# Patient Record
Sex: Female | Born: 1960 | Race: White | Hispanic: No | Marital: Married | Smoking: Never smoker
Health system: Southern US, Community
[De-identification: ages and names within clinical notes are randomized; demographics above are authoritative.]

## PROBLEM LIST (undated history)

## (undated) DIAGNOSIS — K219 Gastro-esophageal reflux disease without esophagitis: Secondary | ICD-10-CM

## (undated) DIAGNOSIS — R42 Dizziness and giddiness: Secondary | ICD-10-CM

## (undated) DIAGNOSIS — F329 Major depressive disorder, single episode, unspecified: Secondary | ICD-10-CM

## (undated) DIAGNOSIS — F419 Anxiety disorder, unspecified: Secondary | ICD-10-CM

## (undated) DIAGNOSIS — J3489 Other specified disorders of nose and nasal sinuses: Secondary | ICD-10-CM

## (undated) DIAGNOSIS — J309 Allergic rhinitis, unspecified: Secondary | ICD-10-CM

## (undated) DIAGNOSIS — I10 Essential (primary) hypertension: Secondary | ICD-10-CM

## (undated) DIAGNOSIS — F32A Depression, unspecified: Secondary | ICD-10-CM

## (undated) DIAGNOSIS — E785 Hyperlipidemia, unspecified: Secondary | ICD-10-CM

## (undated) HISTORY — DX: Major depressive disorder, single episode, unspecified: F32.9

## (undated) HISTORY — PX: TONSILLECTOMY AND ADENOIDECTOMY: SUR1326

## (undated) HISTORY — DX: Dizziness and giddiness: R42

## (undated) HISTORY — DX: Essential (primary) hypertension: I10

## (undated) HISTORY — DX: Hyperlipidemia, unspecified: E78.5

## (undated) HISTORY — DX: Gastro-esophageal reflux disease without esophagitis: K21.9

## (undated) HISTORY — DX: Depression, unspecified: F32.A

## (undated) HISTORY — DX: Anxiety disorder, unspecified: F41.9

## (undated) HISTORY — DX: Allergic rhinitis, unspecified: J30.9

---

## 1985-12-29 HISTORY — PX: LIPOMA EXCISION: SHX5283

## 2005-08-11 ENCOUNTER — Ambulatory Visit: Payer: Self-pay | Admitting: Family Medicine

## 2006-11-24 ENCOUNTER — Ambulatory Visit: Payer: Self-pay | Admitting: Family Medicine

## 2007-04-03 ENCOUNTER — Emergency Department: Payer: Self-pay | Admitting: Emergency Medicine

## 2008-01-05 ENCOUNTER — Ambulatory Visit: Payer: Self-pay | Admitting: Family Medicine

## 2009-01-18 DIAGNOSIS — F341 Dysthymic disorder: Secondary | ICD-10-CM | POA: Insufficient documentation

## 2009-02-09 DIAGNOSIS — H8110 Benign paroxysmal vertigo, unspecified ear: Secondary | ICD-10-CM | POA: Insufficient documentation

## 2009-02-21 ENCOUNTER — Ambulatory Visit: Payer: Self-pay | Admitting: Family Medicine

## 2009-03-06 ENCOUNTER — Ambulatory Visit: Payer: Self-pay | Admitting: Family Medicine

## 2009-09-27 ENCOUNTER — Ambulatory Visit: Payer: Self-pay | Admitting: Family Medicine

## 2010-08-05 LAB — HM PAP SMEAR

## 2010-11-05 ENCOUNTER — Ambulatory Visit: Payer: Self-pay | Admitting: Family Medicine

## 2012-01-19 ENCOUNTER — Ambulatory Visit: Payer: Self-pay | Admitting: Family Medicine

## 2012-02-17 ENCOUNTER — Ambulatory Visit: Payer: Self-pay | Admitting: Urology

## 2013-05-10 ENCOUNTER — Ambulatory Visit: Payer: Self-pay | Admitting: Family Medicine

## 2013-05-10 LAB — CBC WITH DIFFERENTIAL/PLATELET
Basophil #: 0.2 10*3/uL — ABNORMAL HIGH (ref 0.0–0.1)
Basophil %: 3 %
Eosinophil #: 0.1 10*3/uL (ref 0.0–0.7)
Eosinophil %: 2.1 %
HGB: 13.2 g/dL (ref 12.0–16.0)
Lymphocyte #: 1.6 10*3/uL (ref 1.0–3.6)
Lymphocyte %: 24.2 %
MCH: 29.6 pg (ref 26.0–34.0)
MCHC: 33.9 g/dL (ref 32.0–36.0)
MCV: 87 fL (ref 80–100)
Monocyte #: 0.5 x10 3/mm (ref 0.2–0.9)
Monocyte %: 7.4 %
Neutrophil %: 63.3 %
Platelet: 254 10*3/uL (ref 150–440)

## 2013-05-10 LAB — LIPID PANEL
Cholesterol: 277 mg/dL — ABNORMAL HIGH (ref 0–200)
Ldl Cholesterol, Calc: 197 mg/dL — ABNORMAL HIGH (ref 0–100)
Triglycerides: 233 mg/dL — ABNORMAL HIGH (ref 0–200)

## 2013-05-10 LAB — COMPREHENSIVE METABOLIC PANEL
Albumin: 3.9 g/dL (ref 3.4–5.0)
Alkaline Phosphatase: 88 U/L (ref 50–136)
Anion Gap: 8 (ref 7–16)
BUN: 13 mg/dL (ref 7–18)
Bilirubin,Total: 0.6 mg/dL (ref 0.2–1.0)
Calcium, Total: 9 mg/dL (ref 8.5–10.1)
Chloride: 100 mmol/L (ref 98–107)
Co2: 30 mmol/L (ref 21–32)
Creatinine: 0.99 mg/dL (ref 0.60–1.30)
EGFR (African American): >60
Glucose: 104 mg/dL — ABNORMAL HIGH (ref 65–99)
Osmolality: 276 (ref 275–301)
Potassium: 3.8 mmol/L (ref 3.5–5.1)
SGOT(AST): 43 U/L — ABNORMAL HIGH (ref 15–37)
Total Protein: 7.5 g/dL (ref 6.4–8.2)

## 2013-05-10 LAB — TSH: Thyroid Stimulating Horm: 1.99 u[IU]/mL

## 2014-09-20 ENCOUNTER — Ambulatory Visit: Payer: Self-pay | Admitting: Family Medicine

## 2014-09-20 LAB — CBC AND DIFFERENTIAL
HCT: 42 % (ref 36–46)
Hemoglobin: 14.2 g/dL (ref 12.0–16.0)
Platelets: 254 10*3/uL (ref 150–399)
WBC: 6.3 10*3/mL

## 2014-09-20 LAB — LIPID PANEL
Cholesterol: 261 mg/dL — AB (ref 0–200)
HDL: 38 mg/dL (ref 35–70)
LDL Cholesterol: 189 mg/dL
Triglycerides: 169 mg/dL — AB (ref 40–160)

## 2014-09-20 LAB — TSH: TSH: 1.65 u[IU]/mL (ref 0.41–5.90)

## 2014-09-20 LAB — HEPATIC FUNCTION PANEL
ALT: 24 U/L (ref 7–35)
AST: 24 U/L (ref 13–35)

## 2014-09-20 LAB — BASIC METABOLIC PANEL
BUN: 16 mg/dL (ref 4–21)
Creatinine: 1.1 mg/dL (ref 0.5–1.1)
GLUCOSE: 101 mg/dL
Potassium: 4.1 mmol/L (ref 3.4–5.3)
Sodium: 141 mmol/L (ref 137–147)

## 2015-07-03 ENCOUNTER — Encounter: Payer: Self-pay | Admitting: Family Medicine

## 2015-07-03 ENCOUNTER — Encounter: Payer: Self-pay | Admitting: *Deleted

## 2015-07-03 ENCOUNTER — Ambulatory Visit (INDEPENDENT_AMBULATORY_CARE_PROVIDER_SITE_OTHER): Payer: 59 | Admitting: Family Medicine

## 2015-07-03 VITALS — BP 118/84 | HR 69 | Temp 98.4°F | Resp 16 | Wt 193.0 lb

## 2015-07-03 DIAGNOSIS — N3090 Cystitis, unspecified without hematuria: Secondary | ICD-10-CM | POA: Diagnosis not present

## 2015-07-03 DIAGNOSIS — I10 Essential (primary) hypertension: Secondary | ICD-10-CM | POA: Insufficient documentation

## 2015-07-03 DIAGNOSIS — K219 Gastro-esophageal reflux disease without esophagitis: Secondary | ICD-10-CM | POA: Insufficient documentation

## 2015-07-03 DIAGNOSIS — F419 Anxiety disorder, unspecified: Secondary | ICD-10-CM | POA: Insufficient documentation

## 2015-07-03 DIAGNOSIS — F329 Major depressive disorder, single episode, unspecified: Secondary | ICD-10-CM | POA: Insufficient documentation

## 2015-07-03 DIAGNOSIS — F32A Depression, unspecified: Secondary | ICD-10-CM | POA: Insufficient documentation

## 2015-07-03 DIAGNOSIS — J309 Allergic rhinitis, unspecified: Secondary | ICD-10-CM | POA: Insufficient documentation

## 2015-07-03 DIAGNOSIS — R319 Hematuria, unspecified: Secondary | ICD-10-CM | POA: Insufficient documentation

## 2015-07-03 DIAGNOSIS — E7800 Pure hypercholesterolemia, unspecified: Secondary | ICD-10-CM | POA: Insufficient documentation

## 2015-07-03 LAB — POCT URINALYSIS DIPSTICK
BILIRUBIN UA: NEGATIVE
Glucose, UA: NEGATIVE
LEUKOCYTES UA: NEGATIVE
Nitrite, UA: NEGATIVE
PH UA: 6
Protein, UA: NEGATIVE
Urobilinogen, UA: 0.2

## 2015-07-03 MED ORDER — ALPRAZOLAM 0.5 MG PO TABS: ORAL_TABLET | ORAL | Status: AC

## 2015-07-03 MED ORDER — NITROFURANTOIN MONOHYD MACRO 100 MG PO CAPS: 100.0000 mg | ORAL_CAPSULE | Freq: Two times a day (BID) | ORAL | Status: DC

## 2015-07-03 NOTE — Patient Instructions (Signed)
Continue increased fluid intake and AZO or Uristat as needed.

## 2015-07-03 NOTE — Progress Notes (Signed)
Subjective:     Patient ID: Lorraine Robinson, female   DOB: 08-Apr-1961, 54 y.o.   MRN: [id number]  HPI  Chief Complaint  Patient presents with  . Urinary Frequency  . Urinary Urgency  . Back Pain  States onset 7/1. No recent infection noted on chart review.    Review of Systems  Constitutional: Negative for fever and chills.  Genitourinary: Negative for vaginal discharge.       Reports prior negative GU workup for hematuria  Psychiatric/Behavioral:       Wishes to refill Xanax prior to a vacation. States prior rx expired as she did not use it.       Objective:   Physical Exam  Constitutional: She appears well-developed and well-nourished. No distress.  Genitourinary:  No cva tenderness       Assessment:    1. Cystitis-suggested treating symptoms until culture available and holding antibiotic - Urine culture - POCT urinalysis dipstick - nitrofurantoin, macrocrystal-monohydrate, (MACROBID) 100 MG capsule; Take 1 capsule (100 mg total) by mouth 2 (two) times daily.  Dispense: 14 capsule; Refill: 0  2. Acute anxiety - ALPRAZolam (XANAX) 0.5 MG tablet; 1/2 to one as needed for anxiety  Dispense: 15 tablet; Refill: 0    Plan:    Pending culture results.

## 2015-07-05 ENCOUNTER — Telehealth: Payer: Self-pay

## 2015-07-05 LAB — PLEASE NOTE

## 2015-07-05 LAB — URINE CULTURE

## 2015-07-05 NOTE — Telephone Encounter (Signed)
Left message to call back  

## 2015-07-05 NOTE — Telephone Encounter (Signed)
-----   Message from Carmon Ginsberg, [place] sent at 07/05/2015  7:46 AM EDT ----- No specific organism detected on culture. May continue antibiotic if helping.

## 2015-07-09 NOTE — Telephone Encounter (Signed)
Pt is return call.  CB#[id number]/MW

## 2015-07-10 NOTE — Telephone Encounter (Signed)
LMTCB

## 2015-07-16 NOTE — Telephone Encounter (Signed)
Patient was advised. KW 

## 2015-09-26 ENCOUNTER — Encounter: Payer: Self-pay | Admitting: Family Medicine

## 2015-09-26 ENCOUNTER — Other Ambulatory Visit: Payer: Self-pay | Admitting: Family Medicine

## 2015-09-26 MED ORDER — METOPROLOL SUCCINATE ER 25 MG PO TB24: 25.0000 mg | ORAL_TABLET | Freq: Every day | ORAL | Status: DC

## 2015-09-28 ENCOUNTER — Ambulatory Visit (INDEPENDENT_AMBULATORY_CARE_PROVIDER_SITE_OTHER): Payer: 59 | Admitting: Family Medicine

## 2015-09-28 ENCOUNTER — Encounter: Payer: Self-pay | Admitting: Family Medicine

## 2015-09-28 VITALS — BP 128/82 | HR 68 | Temp 98.6°F | Resp 16 | Wt 190.4 lb

## 2015-09-28 DIAGNOSIS — I10 Essential (primary) hypertension: Secondary | ICD-10-CM

## 2015-09-28 DIAGNOSIS — F329 Major depressive disorder, single episode, unspecified: Secondary | ICD-10-CM

## 2015-09-28 DIAGNOSIS — F419 Anxiety disorder, unspecified: Secondary | ICD-10-CM

## 2015-09-28 DIAGNOSIS — F32A Depression, unspecified: Secondary | ICD-10-CM

## 2015-09-28 NOTE — Progress Notes (Signed)
Patient ID: Lorraine Robinson, female   DOB: 01-Jun-1961, 54 y.o.   MRN: [id number]    Subjective:  HPI Patient reports she is having increased anxiety due to receiving a jury summons for court. Patient states she received the summons 2 weeks ago, and her anxiety is worse the closer she gets to the date of jury duty.  Has been on a jury before and it did not go well.   Had such a bad experience. Caused a panic attacks.  Has trouble sitting in one place without an escape. Stays in back at church.  Always thinks something bad is going to happen.  Consumes all her thoughts.  Has really become a phobia.  Worries about what will happen while she is on a  jury duty. Family illness and deaths always happen when she is not available. Her sister fell down the escalator while she was away.  Freaked out before her last jury summons and had to see her doctor.   Does have physical symptoms with palpitations and panic attacks.  Does not like people staring at her.  Is on medication for her anxiety.   Had to cut it back because she feels lifeless.   Prior to Admission medications   Medication Sig Start Date End Date Taking? Authorizing Provider  ALPRAZolam Duanne Moron) 0.5 MG tablet 1/2 to one as needed for anxiety 07/03/15  Yes Carmon Ginsberg, PA  citalopram (CELEXA) 20 MG tablet Take by mouth. 08/29/13  Yes Historical Provider, MD  fluticasone (FLONASE) 50 MCG/ACT nasal spray Place into the nose. 09/14/14  Yes Historical Provider, MD  hydrochlorothiazide (HYDRODIURIL) 12.5 MG tablet Take by mouth. 01/22/15  Yes Historical Provider, MD  loratadine (CLARITIN) 10 MG tablet Take by mouth. 08/07/10  Yes Historical Provider, MD  metoprolol succinate (TOPROL-XL) 25 MG 24 hr tablet Take 1 tablet (25 mg total) by mouth daily. 09/26/15  Yes Margarita Rana, MD  naproxen sodium (ALEVE) 220 MG tablet Take by mouth.   Yes Historical Provider, MD    Patient Active Problem List   Diagnosis Date Noted  . Allergic rhinitis 07/03/2015  .  Anxiety 07/03/2015  . Depression 07/03/2015  . GERD (gastroesophageal reflux disease) 07/03/2015  . Hematuria 07/03/2015  . Hypercholesteremia 07/03/2015  . Hypertension 07/03/2015  . Disease of nasal cavity and sinuses 08/07/2010  . Vertigo, benign positional 02/09/2009  . Dysthymic disorder 01/18/2009  . Morbid obesity 12/29/1998    Past Medical History  Diagnosis Date  . Hyperlipidemia   . Depression   . Anxiety   . Hypertension   . GERD (gastroesophageal reflux disease)   . Vertigo   . Allergic rhinitis     Social History   Social History  . Marital Status: Married    Spouse Name: N/A  . Number of Children: N/A  . Years of Education: [employer]   Occupational History  . Employed    Social History Main Topics  . Smoking status: Never Smoker   . Smokeless tobacco: Not on file  . Alcohol Use: No  . Drug Use: No  . Sexual Activity: Not on file   Other Topics Concern  . Not on file   Social History Narrative    No Known Allergies  Review of Systems  Constitutional: Negative.   HENT: Negative.   Eyes: Negative.   Respiratory: Negative.   Cardiovascular: Negative.   Gastrointestinal: Negative.   Genitourinary: Negative.   Musculoskeletal: Negative.   Skin: Negative.   Neurological: Negative.   Endo/Heme/Allergies:  Negative.   Psychiatric/Behavioral: The patient is nervous/anxious.     Immunization History  Administered Date(s) Administered  . Td 06/28/2005  . Tdap 08/06/2011   Objective:  BP 128/82 mmHg  Pulse 68  Temp(Src) 98.6 F (37 C) (Oral)  Resp 16  Wt 190 lb 6.4 oz (86.365 kg)  Physical Exam  Constitutional: She is oriented to person, place, and time and well-developed, well-nourished, and in no distress.  Neurological: She is alert and oriented to person, place, and time.  Psychiatric: Mood, memory, affect and judgment normal.    Assessment and Plan :  1. Essential hypertension Condition is stable. Please continue current  medication and  plan of care as noted.    2. Anxiety Worsening since got jury summons. Will write note to excuse patient from jury duty.  Also, continue medication and consider therapy.   3. Depression As above. Follow up for CPE.     [facility] 09/28/2015 10:53 AM

## 2015-10-10 ENCOUNTER — Encounter: Payer: Self-pay | Admitting: Family Medicine

## 2015-10-10 ENCOUNTER — Ambulatory Visit (INDEPENDENT_AMBULATORY_CARE_PROVIDER_SITE_OTHER): Payer: 59 | Admitting: Family Medicine

## 2015-10-10 VITALS — BP 160/90 | HR 67 | Temp 98.0°F | Resp 16 | Ht 64.0 in | Wt 191.0 lb

## 2015-10-10 DIAGNOSIS — R319 Hematuria, unspecified: Secondary | ICD-10-CM | POA: Diagnosis not present

## 2015-10-10 DIAGNOSIS — Z1211 Encounter for screening for malignant neoplasm of colon: Secondary | ICD-10-CM

## 2015-10-10 DIAGNOSIS — Z1239 Encounter for other screening for malignant neoplasm of breast: Secondary | ICD-10-CM | POA: Diagnosis not present

## 2015-10-10 DIAGNOSIS — E7800 Pure hypercholesterolemia, unspecified: Secondary | ICD-10-CM | POA: Diagnosis not present

## 2015-10-10 DIAGNOSIS — Z0000 Encounter for general adult medical examination without abnormal findings: Secondary | ICD-10-CM

## 2015-10-10 DIAGNOSIS — I10 Essential (primary) hypertension: Secondary | ICD-10-CM

## 2015-10-10 LAB — POCT URINALYSIS DIPSTICK
BILIRUBIN UA: NEGATIVE
Glucose, UA: NEGATIVE
Ketones, UA: NEGATIVE
LEUKOCYTES UA: NEGATIVE
NITRITE UA: NEGATIVE
PH UA: 5
PROTEIN UA: NEGATIVE
Spec Grav, UA: 1.015
UROBILINOGEN UA: 0.2

## 2015-10-10 MED ORDER — HYDROCHLOROTHIAZIDE 12.5 MG PO TABS: 12.5000 mg | ORAL_TABLET | Freq: Every day | ORAL | Status: DC

## 2015-10-10 NOTE — Progress Notes (Signed)
Patient ID: Lorraine Robinson, female   DOB: 1961-11-13, 54 y.o.   MRN: [id number]        Patient: Lorraine Robinson, Female    DOB: 04-08-1961, 54 y.o.   MRN: [id number] Visit Date: 10/10/2015  Today's Provider: Margarita Rana, MD   Chief Complaint  Patient presents with  . Annual Exam   Subjective:    Annual physical exam Lorraine Robinson is a 54 y.o. female who presents today for health maintenance and complete physical. She feels well. She reports exercising daily. She reports she is sleeping well.  09/14/14 CPE 09/14/14 Pap-neg; HPV-neg 09/20/14 Mammo-BI-RADS 1 11/24/11 EKG  Results for orders placed or performed in visit on 10/10/15  POCT urinalysis dipstick  Result Value Ref Range   Color, UA straw    Clarity, UA clear    Glucose, UA neg    Bilirubin, UA neg    Ketones, UA neg    Spec Grav, UA 1.015    Blood, UA trace    pH, UA 5.0    Protein, UA neg    Urobilinogen, UA 0.2    Nitrite, UA neg    Leukocytes, UA Negative Negative    Lab Results  Component Value Date   WBC 6.3 09/20/2014   HGB 14.2 09/20/2014   HCT 42 09/20/2014   PLT 254 09/20/2014   GLUCOSE 104* 05/10/2013   CHOL 261* 09/20/2014   TRIG 169* 09/20/2014   HDL 38 09/20/2014   LDLCALC 189 09/20/2014   ALT 24 09/20/2014   AST 24 09/20/2014   NA 141 09/20/2014   K 4.1 09/20/2014   CL 100 05/10/2013   CREATININE 1.1 09/20/2014   BUN 16 09/20/2014   CO2 30 05/10/2013   TSH 1.65 09/20/2014    -----------------------------------------------------------------   Review of Systems  Constitutional: Negative.   HENT: Negative.   Eyes: Negative.   Respiratory: Negative.   Cardiovascular: Negative.   Endocrine: Negative.   Genitourinary: Negative.   Musculoskeletal: Negative.   Skin: Negative.   Allergic/Immunologic: Negative.   Neurological: Negative.   Hematological: Negative.   Psychiatric/Behavioral: Negative.     Social History She  reports that she has  never smoked. She does not have any smokeless tobacco history on file. She reports that she does not drink alcohol or use illicit drugs. Social History   Social History  . Marital Status: Married    Spouse Name: N/A  . Number of Children: N/A  . Years of Education: [employer]   Occupational History  . Employed    Social History Main Topics  . Smoking status: Never Smoker   . Smokeless tobacco: None  . Alcohol Use: No  . Drug Use: No  . Sexual Activity: Not Asked   Other Topics Concern  . None   Social History Narrative    Patient Active Problem List   Diagnosis Date Noted  . Allergic rhinitis 07/03/2015  . Anxiety 07/03/2015  . Depression 07/03/2015  . GERD (gastroesophageal reflux disease) 07/03/2015  . Hematuria 07/03/2015  . Hypercholesteremia 07/03/2015  . Hypertension 07/03/2015  . Disease of nasal cavity and sinuses 08/07/2010  . Vertigo, benign positional 02/09/2009  . Dysthymic disorder 01/18/2009  . Morbid obesity ([place]) 12/29/1998    Past Surgical History  Procedure Laterality Date  . Tonsillectomy and adenoidectomy  1960's  . Lipoma excision Right 1987    Arm    Family History  Family Status  Relation Status Death Age  . Mother Deceased 29  C-Diff  . Father Deceased   . Sister Alive   . Brother Alive   . Sister Alive    Her family history includes Breast cancer in her mother; COPD in her mother; Dementia in her mother; Diabetes in her father; Heart disease in her father.    No Known Allergies  Previous Medications   ALPRAZOLAM (XANAX) 0.5 MG TABLET    1/2 to one as needed for anxiety   CITALOPRAM (CELEXA) 20 MG TABLET    Take by mouth.    FLUTICASONE (FLONASE) 50 MCG/ACT NASAL SPRAY    Place 2 sprays into the nose.    HYDROCHLOROTHIAZIDE (HYDRODIURIL) 12.5 MG TABLET    Take by mouth.    LORATADINE (CLARITIN) 10 MG TABLET    Take 10 mg by mouth daily as needed.    METOPROLOL SUCCINATE (TOPROL-XL) 25 MG 24 HR TABLET    Take 1 tablet (25 mg  total) by mouth daily.   NAPROXEN SODIUM (ALEVE) 220 MG TABLET    Take by mouth.     Patient Care Team: Margarita Rana, MD as PCP - General (Family Medicine)     Objective:   Vitals: BP 160/90 mmHg  Pulse 67  Temp(Src) 98 F (36.7 C) (Oral)  Resp 16  Ht 5\' 4"  (1.626 m)  Wt 191 lb (86.637 kg)  BMI 32.77 kg/m2  SpO2 100%   Physical Exam  Constitutional: She is oriented to person, place, and time. She appears well-developed and well-nourished.  HENT:  Head: Normocephalic and atraumatic.  Right Ear: Tympanic membrane, external ear and ear canal normal.  Left Ear: Tympanic membrane, external ear and ear canal normal.  Nose: Nose normal.  Mouth/Throat: Uvula is midline, oropharynx is clear and moist and mucous membranes are normal.  Eyes: Conjunctivae, EOM and lids are normal. Pupils are equal, round, and reactive to light.  Neck: Trachea normal and normal range of motion. Neck supple. Carotid bruit is not present. No thyroid mass and no thyromegaly present.  Cardiovascular: Normal rate, regular rhythm and normal heart sounds.   Pulmonary/Chest: Effort normal and breath sounds normal.  Abdominal: Soft. Normal appearance and bowel sounds are normal. There is no hepatosplenomegaly. There is no tenderness.  Genitourinary: No breast swelling, tenderness or discharge.  Musculoskeletal: Normal range of motion.  Lymphadenopathy:    She has no cervical adenopathy.    She has no axillary adenopathy.  Neurological: She is alert and oriented to person, place, and time. She has normal strength. No cranial nerve deficit.  Skin: Skin is warm, dry and intact.  Psychiatric: She has a normal mood and affect. Her speech is normal and behavior is normal. Judgment and thought content normal. Cognition and memory are normal.     Depression Screen PHQ 2/9 Scores 10/10/2015  PHQ - 2 Score 0      Assessment & Plan:     Routine Health Maintenance and Physical Exam  Exercise Activities and  Dietary recommendations Goals    . Exercise 150 minutes per week (moderate activity)       Immunization History  Administered Date(s) Administered  . Td 06/28/2005  . Tdap 08/06/2011    Health Maintenance  Topic Date Due  . Hepatitis C Screening  05/25/1961  . HIV Screening  08/01/1977  . MAMMOGRAM  08/01/2012  . COLONOSCOPY  08/01/2012  . PAP SMEAR  08/05/2013  . INFLUENZA VACCINE  07/30/2015  . TETANUS/TDAP  08/05/2021        1. Annual physical exam Stable.  Patient advised to continue eating healthy and exercise daily.  2. Essential hypertension Not to goal. Patient advised to discontinue Metoprolol and take HCTZ 12.5 mg daily instead of taking every other day.   - hydrochlorothiazide (HYDRODIURIL) 12.5 MG tablet; Take 1 tablet (12.5 mg total) by mouth daily.  Dispense: 30 tablet; Refill: 5 - CBC with Differential/Platelet - Comprehensive metabolic panel - TSH  3. Hypercholesteremia - Lipid Panel With LDL/HDL Ratio  4. Hematuria Patient has a history of blood in urine. - POCT urinalysis dipstick - Urine Microscopic  5. Breast cancer screening - MM DIGITAL SCREENING BILATERAL; Future  6. Colon cancer screening - Ambulatory referral to Gastroenterology     Patient seen and examined by Dr. Jerrell Belfast, and note scribed by Philbert Riser. Dimas, CMA. I have reviewed the document for accuracy and completeness and I agree with above. Jerrell Belfast, MD   Margarita Rana, MD      --------------------------------------------------------------------

## 2015-10-10 NOTE — Patient Instructions (Signed)
Patient advised to discontinue Metoprolol Succinate and take Hydrochlorothiazide 12.5 mg tablet daily.   Please call the [place] at [facility] to schedule this at [phone]

## 2015-10-12 LAB — URINALYSIS, MICROSCOPIC ONLY
Bacteria, UA: NONE SEEN
CASTS: NONE SEEN /LPF

## 2015-10-12 LAB — PLEASE NOTE

## 2015-11-08 ENCOUNTER — Other Ambulatory Visit: Payer: Self-pay

## 2015-11-08 ENCOUNTER — Telehealth: Payer: Self-pay

## 2015-11-08 ENCOUNTER — Ambulatory Visit
Admission: RE | Admit: 2015-11-08 | Discharge: 2015-11-08 | Disposition: A | Discharge status: Home / Self Care | Payer: 59 | Source: Ambulatory Visit | Attending: Family Medicine | Admitting: Family Medicine

## 2015-11-08 DIAGNOSIS — Z1239 Encounter for other screening for malignant neoplasm of breast: Secondary | ICD-10-CM

## 2015-11-08 DIAGNOSIS — Z1231 Encounter for screening mammogram for malignant neoplasm of breast: Secondary | ICD-10-CM | POA: Insufficient documentation

## 2015-11-08 NOTE — Telephone Encounter (Signed)
Gastroenterology Pre-Procedure Review  Request Date:  Requesting Physician: Dr. Venia Minks  PATIENT REVIEW QUESTIONS: The patient responded to the following health history questions as indicated:    1. Are you having any GI issues? no 2. Do you have a personal history of Polyps? no 3. Do you have a family history of Colon Cancer or Polyps? no 4. Diabetes Mellitus? no 5. Joint replacements in the past 12 months?no 6. Major health problems in the past 3 months?no 7. Any artificial heart valves, MVP, or defibrillator?no    MEDICATIONS & ALLERGIES:    Patient reports the following regarding taking any anticoagulation/antiplatelet therapy:   Plavix, Coumadin, Eliquis, Xarelto, Lovenox, Pradaxa, Brilinta, or Effient? no Aspirin? no  Patient confirms/reports the following medications:  Current Outpatient Prescriptions  Medication Sig Dispense Refill  . ALPRAZolam (XANAX) 0.5 MG tablet 1/2 to one as needed for anxiety 15 tablet 0  . citalopram (CELEXA) 20 MG tablet Take by mouth. 1/2 tablet every other day    . fluticasone (FLONASE) 50 MCG/ACT nasal spray Place 2 sprays into the nose.     . hydrochlorothiazide (HYDRODIURIL) 12.5 MG tablet Take 1 tablet (12.5 mg total) by mouth daily. 30 tablet 5  . loratadine (CLARITIN) 10 MG tablet Take 10 mg by mouth daily as needed.     . naproxen sodium (ALEVE) 220 MG tablet Take by mouth.      No current facility-administered medications for this visit.    Patient confirms/reports the following allergies:  No Known Allergies  No orders of the defined types were placed in this encounter.    AUTHORIZATION INFORMATION Primary Insurance: 1D#: Group #:  Secondary Insurance: 1D#: Group #:  SCHEDULE INFORMATION: Date: 11/30/15 Time: Location: [place]

## 2015-11-09 ENCOUNTER — Telehealth: Payer: Self-pay

## 2015-11-09 DIAGNOSIS — E7800 Pure hypercholesterolemia, unspecified: Secondary | ICD-10-CM

## 2015-11-09 LAB — LIPID PANEL WITH LDL/HDL RATIO
Cholesterol, Total: 271 mg/dL — ABNORMAL HIGH (ref 100–199)
HDL: 41 mg/dL (ref >39)
LDL Calculated: 203 mg/dL — ABNORMAL HIGH (ref 0–99)
LDl/HDL Ratio: 5 ratio units — ABNORMAL HIGH (ref 0.0–3.2)
TRIGLYCERIDES: 135 mg/dL (ref 0–149)
VLDL CHOLESTEROL CAL: 27 mg/dL (ref 5–40)

## 2015-11-09 LAB — COMPREHENSIVE METABOLIC PANEL
A/G RATIO: 2 (ref 1.1–2.5)
ALT: 19 IU/L (ref 0–32)
AST: 24 IU/L (ref 0–40)
Albumin: 4.5 g/dL (ref 3.5–5.5)
Alkaline Phosphatase: 90 IU/L (ref 39–117)
BUN/Creatinine Ratio: 19 (ref 9–23)
BUN: 17 mg/dL (ref 6–24)
Bilirubin Total: 0.5 mg/dL (ref 0.0–1.2)
CALCIUM: 9.5 mg/dL (ref 8.7–10.2)
CO2: 25 mmol/L (ref 18–29)
Chloride: 101 mmol/L (ref 97–106)
Creatinine, Ser: 0.91 mg/dL (ref 0.57–1.00)
GFR calc Af Amer: 83 mL/min/{1.73_m2} (ref >59)
GFR, EST NON AFRICAN AMERICAN: 72 mL/min/{1.73_m2} (ref >59)
GLUCOSE: 75 mg/dL (ref 65–99)
Globulin, Total: 2.3 g/dL (ref 1.5–4.5)
POTASSIUM: 4.3 mmol/L (ref 3.5–5.2)
Sodium: 144 mmol/L (ref 136–144)
TOTAL PROTEIN: 6.8 g/dL (ref 6.0–8.5)

## 2015-11-09 LAB — CBC WITH DIFFERENTIAL/PLATELET
BASOS ABS: 0 10*3/uL (ref 0.0–0.2)
BASOS: 1 %
EOS (ABSOLUTE): 0.1 10*3/uL (ref 0.0–0.4)
Eos: 3 %
Hematocrit: 40.6 % (ref 34.0–46.6)
Hemoglobin: 13.4 g/dL (ref 11.1–15.9)
IMMATURE GRANS (ABS): 0 10*3/uL (ref 0.0–0.1)
IMMATURE GRANULOCYTES: 0 %
LYMPHS: 32 %
Lymphocytes Absolute: 1.8 10*3/uL (ref 0.7–3.1)
MCH: 29.1 pg (ref 26.6–33.0)
MCHC: 33 g/dL (ref 31.5–35.7)
MCV: 88 fL (ref 79–97)
MONOS ABS: 0.5 10*3/uL (ref 0.1–0.9)
Monocytes: 9 %
NEUTROS PCT: 55 %
Neutrophils Absolute: 3.2 10*3/uL (ref 1.4–7.0)
PLATELETS: 215 10*3/uL (ref 150–379)
RBC: 4.6 x10E6/uL (ref 3.77–5.28)
RDW: 13 % (ref 12.3–15.4)
WBC: 5.7 10*3/uL (ref 3.4–10.8)

## 2015-11-09 LAB — TSH: TSH: 1.81 u[IU]/mL (ref 0.450–4.500)

## 2015-11-09 MED ORDER — ATORVASTATIN CALCIUM 10 MG PO TABS: 10.0000 mg | ORAL_TABLET | Freq: Every day | ORAL | Status: AC

## 2015-11-09 NOTE — Telephone Encounter (Signed)
Sent in rx. Make sure to call for recheck labs in 6 weeks. Stop if any muscle pain.  Thanks.

## 2015-11-09 NOTE — Telephone Encounter (Signed)
Pt agrees to start Statin. [facility] Frederich Balding, CMA

## 2015-11-09 NOTE — Telephone Encounter (Signed)
-----   Message from Margarita Rana, MD sent at 11/09/2015 10:04 AM EST ----- Labs stable except for very high cholesterol at 271, with very high ldl at 202. Would recommend a statin. Thanks.

## 2015-11-09 NOTE — Telephone Encounter (Signed)
Informed pt as below. Lunah Losasso Drozdowski, CMA  

## 2015-11-20 ENCOUNTER — Encounter: Payer: Self-pay | Admitting: *Deleted

## 2015-11-29 NOTE — Discharge Instructions (Signed)

## 2015-11-30 ENCOUNTER — Encounter
Admission: RE | Disposition: A | Discharge status: Home / Self Care | Payer: Self-pay | Source: Ambulatory Visit | Attending: Gastroenterology

## 2015-11-30 ENCOUNTER — Ambulatory Visit
Admission: RE | Admit: 2015-11-30 | Discharge: 2015-11-30 | Disposition: A | Discharge status: Home / Self Care | Payer: Commercial Managed Care - HMO | Source: Ambulatory Visit | Attending: Gastroenterology | Admitting: Gastroenterology

## 2015-11-30 ENCOUNTER — Ambulatory Visit: Payer: Commercial Managed Care - HMO | Admitting: Anesthesiology

## 2015-11-30 ENCOUNTER — Other Ambulatory Visit: Payer: Self-pay | Admitting: Gastroenterology

## 2015-11-30 DIAGNOSIS — I10 Essential (primary) hypertension: Secondary | ICD-10-CM | POA: Insufficient documentation

## 2015-11-30 DIAGNOSIS — Z79899 Other long term (current) drug therapy: Secondary | ICD-10-CM | POA: Diagnosis not present

## 2015-11-30 DIAGNOSIS — E785 Hyperlipidemia, unspecified: Secondary | ICD-10-CM | POA: Insufficient documentation

## 2015-11-30 DIAGNOSIS — K640 First degree hemorrhoids: Secondary | ICD-10-CM | POA: Insufficient documentation

## 2015-11-30 DIAGNOSIS — F419 Anxiety disorder, unspecified: Secondary | ICD-10-CM | POA: Diagnosis not present

## 2015-11-30 DIAGNOSIS — Z7951 Long term (current) use of inhaled steroids: Secondary | ICD-10-CM | POA: Insufficient documentation

## 2015-11-30 DIAGNOSIS — Z1211 Encounter for screening for malignant neoplasm of colon: Secondary | ICD-10-CM | POA: Diagnosis present

## 2015-11-30 DIAGNOSIS — F329 Major depressive disorder, single episode, unspecified: Secondary | ICD-10-CM | POA: Insufficient documentation

## 2015-11-30 DIAGNOSIS — D124 Benign neoplasm of descending colon: Secondary | ICD-10-CM | POA: Insufficient documentation

## 2015-11-30 DIAGNOSIS — Z825 Family history of asthma and other chronic lower respiratory diseases: Secondary | ICD-10-CM | POA: Insufficient documentation

## 2015-11-30 DIAGNOSIS — Z791 Long term (current) use of non-steroidal anti-inflammatories (NSAID): Secondary | ICD-10-CM | POA: Insufficient documentation

## 2015-11-30 DIAGNOSIS — Z803 Family history of malignant neoplasm of breast: Secondary | ICD-10-CM | POA: Diagnosis not present

## 2015-11-30 DIAGNOSIS — Z8249 Family history of ischemic heart disease and other diseases of the circulatory system: Secondary | ICD-10-CM | POA: Diagnosis not present

## 2015-11-30 DIAGNOSIS — Z818 Family history of other mental and behavioral disorders: Secondary | ICD-10-CM | POA: Insufficient documentation

## 2015-11-30 DIAGNOSIS — Z9889 Other specified postprocedural states: Secondary | ICD-10-CM | POA: Insufficient documentation

## 2015-11-30 DIAGNOSIS — Z833 Family history of diabetes mellitus: Secondary | ICD-10-CM | POA: Diagnosis not present

## 2015-11-30 HISTORY — PX: COLONOSCOPY WITH PROPOFOL: SHX5780

## 2015-11-30 HISTORY — PX: POLYPECTOMY: SHX5525

## 2015-11-30 HISTORY — DX: Other specified disorders of nose and nasal sinuses: J34.89

## 2015-11-30 SURGERY — COLONOSCOPY WITH PROPOFOL
Anesthesia: Monitor Anesthesia Care | Wound class: Contaminated

## 2015-11-30 MED ORDER — LACTATED RINGERS IV SOLN
INTRAVENOUS | Status: DC
  Administered 2015-11-30: 09:00:00 via INTRAVENOUS

## 2015-11-30 MED ORDER — LIDOCAINE HCL (CARDIAC) 20 MG/ML IV SOLN
INTRAVENOUS | Status: DC | PRN
Start: 2015-11-30 — End: 2015-11-30
  Administered 2015-11-30: 40 mg via INTRAVENOUS

## 2015-11-30 MED ORDER — STERILE WATER FOR IRRIGATION IR SOLN
Status: DC | PRN
  Administered 2015-11-30: 10:00:00

## 2015-11-30 MED ORDER — PROPOFOL 10 MG/ML IV BOLUS
INTRAVENOUS | Status: DC | PRN
Start: 2015-11-30 — End: 2015-11-30
  Administered 2015-11-30 (×2): 50 mg via INTRAVENOUS
  Administered 2015-11-30: 30 mg via INTRAVENOUS
  Administered 2015-11-30: 100 mg via INTRAVENOUS
  Administered 2015-11-30: 20 mg via INTRAVENOUS

## 2015-11-30 SURGICAL SUPPLY — 28 items
CANISTER SUCT 1200ML W/VALVE (MISCELLANEOUS) ×4 IMPLANT
FCP ESCP3.2XJMB 240X2.8X (MISCELLANEOUS)
FORCEPS BIOP RAD 4 LRG CAP 4 (CUTTING FORCEPS) IMPLANT
FORCEPS BIOP RJ4 240 W/NDL (MISCELLANEOUS)
FORCEPS ESCP3.2XJMB 240X2.8X (MISCELLANEOUS) IMPLANT
GOWN CVR UNV OPN BCK APRN NK (MISCELLANEOUS) ×4 IMPLANT
GOWN ISOL THUMB LOOP REG UNIV (MISCELLANEOUS) ×4
HEMOCLIP INSTINCT (CLIP) IMPLANT
INJECTOR VARIJECT VIN23 (MISCELLANEOUS) IMPLANT
KIT CO2 TUBING (TUBING) IMPLANT
KIT DEFENDO VALVE AND CONN (KITS) IMPLANT
KIT ENDO PROCEDURE OLY (KITS) ×4 IMPLANT
LIGATOR MULTIBAND 6SHOOTER MBL (MISCELLANEOUS) IMPLANT
MARKER SPOT ENDO TATTOO 5ML (MISCELLANEOUS) IMPLANT
PAD GROUND ADULT SPLIT (MISCELLANEOUS) IMPLANT
SNARE SHORT THROW 13M SML OVAL (MISCELLANEOUS) ×4 IMPLANT
SNARE SHORT THROW 30M LRG OVAL (MISCELLANEOUS) IMPLANT
SPOT EX ENDOSCOPIC TATTOO (MISCELLANEOUS)
SUCTION POLY TRAP 4CHAMBER (MISCELLANEOUS) IMPLANT
TRAP SUCTION POLY (MISCELLANEOUS) IMPLANT
TUBING CONN 6MMX3.1M (TUBING)
TUBING SUCTION CONN 0.25 STRL (TUBING) IMPLANT
UNDERPAD 30X60 958B10 (PK) (MISCELLANEOUS) IMPLANT
VALVE BIOPSY ENDO (VALVE) IMPLANT
VARIJECT INJECTOR VIN23 (MISCELLANEOUS)
WATER AUXILLARY (MISCELLANEOUS) IMPLANT
WATER STERILE IRR 250ML POUR (IV SOLUTION) ×4 IMPLANT
WATER STERILE IRR 500ML POUR (IV SOLUTION) IMPLANT

## 2015-11-30 NOTE — Anesthesia Preprocedure Evaluation (Signed)
Anesthesia Evaluation  Patient identified by MRN, date of birth, ID band Patient awake    Reviewed: Allergy & Precautions, H&P , NPO status , Patient's Chart, lab work & pertinent test results, reviewed documented beta blocker date and time   Airway Mallampati: I  TM Distance: >3 FB Neck ROM: full    Dental no notable dental hx.    Pulmonary neg pulmonary ROS,    Pulmonary exam normal breath sounds clear to auscultation       Cardiovascular Exercise Tolerance: Good hypertension,  Rhythm:regular Rate:Normal     Neuro/Psych PSYCHIATRIC DISORDERS negative neurological ROS     GI/Hepatic Neg liver ROS,   Endo/Other  negative endocrine ROS  Renal/GU negative Renal ROS  negative genitourinary   Musculoskeletal   Abdominal   Peds  Hematology negative hematology ROS (+)   Anesthesia Other Findings   Reproductive/Obstetrics negative OB ROS                             Anesthesia Physical Anesthesia Plan  ASA: II  Anesthesia Plan: MAC   Post-op Pain Management:    Induction: Intravenous  Airway Management Planned: Nasal Cannula  Additional Equipment:   Intra-op Plan:   Post-operative Plan:   Informed Consent: I have reviewed the patients History and Physical, chart, labs and discussed the procedure including the risks, benefits and alternatives for the proposed anesthesia with the patient or authorized representative who has indicated his/her understanding and acceptance.   Dental Advisory Given  Plan Discussed with: CRNA  Anesthesia Plan Comments:         Anesthesia Quick Evaluation

## 2015-11-30 NOTE — Anesthesia Postprocedure Evaluation (Signed)
Anesthesia Post Note  Patient: Lorraine Robinson  Procedure(s) Performed: Procedure(s) (LRB): COLONOSCOPY WITH PROPOFOL (N/A) POLYPECTOMY  Patient location during evaluation: PACU Anesthesia Type: MAC Level of consciousness: awake and alert Pain management: pain level controlled Vital Signs Assessment: post-procedure vital signs reviewed and stable Respiratory status: spontaneous breathing, nonlabored ventilation and respiratory function stable Cardiovascular status: blood pressure returned to baseline and stable Postop Assessment: no signs of nausea or vomiting Anesthetic complications: no    Trecia Rogers

## 2015-11-30 NOTE — Transfer of Care (Signed)
Immediate Anesthesia Transfer of Care Note  Patient: Lorraine Robinson  Procedure(s) Performed: Procedure(s): COLONOSCOPY WITH PROPOFOL (N/A) POLYPECTOMY  Patient Location: PACU  Anesthesia Type: MAC  Level of Consciousness: awake, alert  and patient cooperative  Airway and Oxygen Therapy: Patient Spontanous Breathing and Patient connected to supplemental oxygen  Post-op Assessment: Post-op Vital signs reviewed, Patient's Cardiovascular Status Stable, Respiratory Function Stable, Patent Airway and No signs of Nausea or vomiting  Post-op Vital Signs: Reviewed and stable  Complications: No apparent anesthesia complications

## 2015-11-30 NOTE — H&P (Signed)
[facility]  [address] [place], [place] [address] Phone: [phone] Fax : [phone]  Primary Care Physician:  Margarita Rana, MD Primary Gastroenterologist:  Dr. Allen Norris  Pre-Procedure History & Physical: HPI:  Lorraine Robinson is a 54 y.o. female is here for a screening colonoscopy.   Past Medical History  Diagnosis Date  . Hyperlipidemia   . Depression   . Anxiety   . Hypertension   . Vertigo   . Allergic rhinitis   . GERD (gastroesophageal reflux disease)     in past  . Sinus drainage     Past Surgical History  Procedure Laterality Date  . Tonsillectomy and adenoidectomy  1960's  . Lipoma excision Right 1987    Arm    Prior to Admission medications   Medication Sig Start Date End Date Taking? Authorizing Provider  citalopram (CELEXA) 20 MG tablet Take by mouth. 1/2 tablet every other day 08/29/13  Yes Historical Provider, MD  hydrochlorothiazide (HYDRODIURIL) 12.5 MG tablet Take 1 tablet (12.5 mg total) by mouth daily. 10/10/15  Yes Margarita Rana, MD  loratadine (CLARITIN) 10 MG tablet Take 10 mg by mouth daily as needed.  08/07/10  Yes Historical Provider, MD  naproxen sodium (ALEVE) 220 MG tablet Take by mouth.    Yes Historical Provider, MD  ALPRAZolam Duanne Moron) 0.5 MG tablet 1/2 to one as needed for anxiety Patient not taking: Reported on 11/08/2015 07/03/15   Carmon Ginsberg, PA  atorvastatin (LIPITOR) 10 MG tablet Take 1 tablet (10 mg total) by mouth daily. Patient not taking: Reported on 11/30/2015 11/09/15   Margarita Rana, MD  fluticasone [facility]) 50 MCG/ACT nasal spray Place 2 sprays into the nose.  09/14/14   Historical Provider, MD    Allergies as of 11/08/2015  . (No Known Allergies)    Family History  Problem Relation Age of Onset  . Dementia Mother   . COPD Mother   . Breast cancer Mother 74  . Diabetes Father   . Heart disease Father   . Breast cancer Maternal Aunt 29  . Breast cancer Cousin 42    Social History    Social History  . Marital Status: Married    Spouse Name: N/A  . Number of Children: N/A  . Years of Education: [employer]   Occupational History  . Employed    Social History Main Topics  . Smoking status: Never Smoker   . Smokeless tobacco: Not on file  . Alcohol Use: No  . Drug Use: No  . Sexual Activity: Not on file   Other Topics Concern  . Not on file   Social History Narrative    Review of Systems: See HPI, otherwise negative ROS  Physical Exam: BP 158/89 mmHg  Pulse 90  Temp(Src) 97.5 F (36.4 C)  Resp 16  Ht 5\' 4"  (1.626 m)  Wt 183 lb (83.008 kg)  BMI 31.40 kg/m2  SpO2 99% General:   Alert,  pleasant and cooperative in NAD Head:  Normocephalic and atraumatic. Neck:  Supple; no masses or thyromegaly. Lungs:  Clear throughout to auscultation.    Heart:  Regular rate and rhythm. Abdomen:  Soft, nontender and nondistended. Normal bowel sounds, without guarding, and without rebound.   Neurologic:  Alert and  oriented x4;  grossly normal neurologically.  Impression/Plan: Lorraine Robinson is now here to undergo a screening colonoscopy.  Risks, benefits, and alternatives regarding colonoscopy have been reviewed with the patient.  Questions have been answered.  All parties  agreeable.

## 2015-11-30 NOTE — Anesthesia Procedure Notes (Signed)
Procedure Name: MAC Performed by: Loralei Radcliffe Pre-anesthesia Checklist: Patient identified, Emergency Drugs available, Suction available, Timeout performed and Patient being monitored Patient Re-evaluated:Patient Re-evaluated prior to inductionOxygen Delivery Method: Nasal cannula Placement Confirmation: positive ETCO2     

## 2015-11-30 NOTE — Op Note (Signed)
[facility] Gastroenterology Patient Name: Lorraine Robinson Procedure Date: 11/30/2015 9:33 AM MRN: [id number] Account #: [id number] Date of Birth: Jan 18, 1961 Admit Type: Outpatient Age: 54 Room: [facility] OR ROOM 01 Gender: Female Note Status: Finalized Procedure:         Colonoscopy Indications:       Screening for colorectal malignant neoplasm Providers:         Lucilla Lame, MD Referring MD:      Jerrell Belfast, MD (Referring MD) Medicines:         Propofol per Anesthesia Complications:     No immediate complications. Procedure:         Pre-Anesthesia Assessment:                    - Prior to the procedure, a History and Physical was                     performed, and patient medications and allergies were                     reviewed. The patient's tolerance of previous anesthesia                     was also reviewed. The risks and benefits of the procedure                     and the sedation options and risks were discussed with the                     patient. All questions were answered, and informed consent                     was obtained. Prior Anticoagulants: The patient has taken                     no previous anticoagulant or antiplatelet agents. ASA                     Grade Assessment: II - A patient with mild systemic                     disease. After reviewing the risks and benefits, the                     patient was deemed in satisfactory condition to undergo                     the procedure.                    After obtaining informed consent, the colonoscope was                     passed under direct vision. Throughout the procedure, the                     patient's blood pressure, pulse, and oxygen saturations                     were monitored continuously. The Olympus [id number]                     Colonoscope (S#. [id number]) was introduced through the anus  and advanced to the the cecum, identified by appendiceal                  orifice and ileocecal valve. The colonoscopy was performed                     without difficulty. The patient tolerated the procedure                     well. The quality of the bowel preparation was excellent. Findings:      The perianal and digital rectal examinations were normal.      A 3 mm polyp was found in the descending colon. The polyp was sessile.       The polyp was removed with a cold biopsy forceps. Resection and       retrieval were complete.      Non-bleeding internal hemorrhoids were found during retroflexion. The       hemorrhoids were Grade I (internal hemorrhoids that do not prolapse). Impression:        - One 3 mm polyp in the descending colon. Resected and                     retrieved.                    - Non-bleeding internal hemorrhoids. Recommendation:    - Await pathology results.                    - Repeat colonoscopy in 5 years if polyp adenoma and 10                     years if hyperplastic Procedure Code(s): --- Professional ---                    [phone], Colonoscopy, flexible; with biopsy, single or                     multiple Diagnosis Code(s): --- Professional ---                    Z12.11, Encounter for screening for malignant neoplasm of                     colon                    D12.4, Benign neoplasm of descending colon CPT copyright 2014 American Medical Association. All rights reserved. The codes documented in this report are preliminary and upon coder review may  be revised to meet current compliance requirements. Lucilla Lame, MD 11/30/2015 9:50:42 AM This report has been signed electronically. Number of Addenda: 0 Note Initiated On: 11/30/2015 9:33 AM Scope Withdrawal Time: 0 hours 6 minutes 20 seconds  Total Procedure Duration: 0 hours 9 minutes 40 seconds       [facility]

## 2015-12-03 ENCOUNTER — Encounter: Payer: Self-pay | Admitting: Gastroenterology

## 2015-12-04 ENCOUNTER — Encounter: Payer: Self-pay | Admitting: Gastroenterology

## 2016-04-09 ENCOUNTER — Ambulatory Visit: Payer: 59 | Admitting: Family Medicine

## 2016-08-18 ENCOUNTER — Other Ambulatory Visit: Payer: Self-pay | Admitting: Family Medicine

## 2016-08-18 DIAGNOSIS — I10 Essential (primary) hypertension: Secondary | ICD-10-CM

## 2017-01-02 ENCOUNTER — Other Ambulatory Visit: Payer: Self-pay | Admitting: Family Medicine

## 2017-01-02 DIAGNOSIS — Z1231 Encounter for screening mammogram for malignant neoplasm of breast: Secondary | ICD-10-CM

## 2017-01-12 ENCOUNTER — Ambulatory Visit
Admission: RE | Admit: 2017-01-12 | Discharge: 2017-01-12 | Disposition: A | Discharge status: Home / Self Care | Payer: BLUE CROSS/BLUE SHIELD | Source: Ambulatory Visit | Attending: Family Medicine | Admitting: Family Medicine

## 2017-01-12 DIAGNOSIS — Z1231 Encounter for screening mammogram for malignant neoplasm of breast: Secondary | ICD-10-CM | POA: Diagnosis not present

## 2017-02-19 ENCOUNTER — Other Ambulatory Visit: Payer: Self-pay

## 2017-02-19 DIAGNOSIS — I10 Essential (primary) hypertension: Secondary | ICD-10-CM

## 2017-02-19 MED ORDER — HYDROCHLOROTHIAZIDE 12.5 MG PO TABS: 12.5000 mg | ORAL_TABLET | Freq: Every day | ORAL | 0 refills | Status: AC

## 2017-03-23 ENCOUNTER — Other Ambulatory Visit: Payer: Self-pay

## 2019-01-25 ENCOUNTER — Other Ambulatory Visit: Payer: Self-pay | Admitting: Family Medicine

## 2019-01-25 DIAGNOSIS — Z1231 Encounter for screening mammogram for malignant neoplasm of breast: Secondary | ICD-10-CM

## 2019-01-26 ENCOUNTER — Ambulatory Visit
Admission: RE | Admit: 2019-01-26 | Discharge: 2019-01-26 | Disposition: A | Discharge status: Home / Self Care | Payer: BLUE CROSS/BLUE SHIELD | Source: Ambulatory Visit | Attending: Family Medicine | Admitting: Family Medicine

## 2019-01-26 DIAGNOSIS — Z1231 Encounter for screening mammogram for malignant neoplasm of breast: Secondary | ICD-10-CM

## 2020-07-03 ENCOUNTER — Encounter: Payer: Self-pay | Admitting: *Deleted

## 2020-07-03 ENCOUNTER — Encounter: Payer: Self-pay | Admitting: Family Medicine

## 2020-10-04 IMAGING — MG DIGITAL SCREENING BILATERAL MAMMOGRAM WITH TOMO AND CAD
8 series · 8 of 24 positions shown · non-contrast
Comparison: Previous exam(s).

CLINICAL DATA: Screening.

EXAM:
DIGITAL SCREENING BILATERAL MAMMOGRAM WITH TOMO AND CAD

[R CC synth-2D]
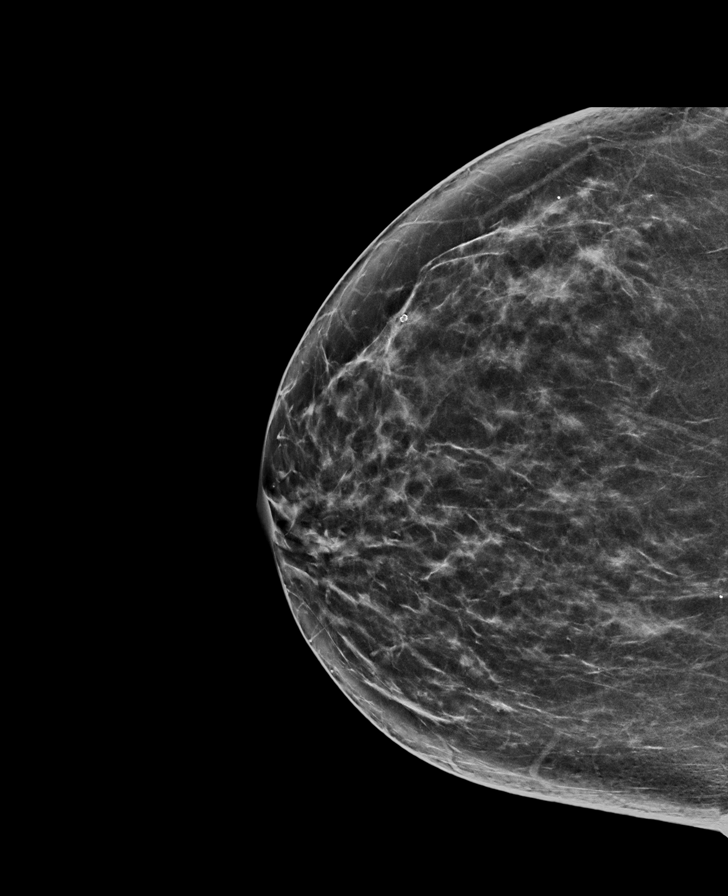

[R MLO synth-2D]
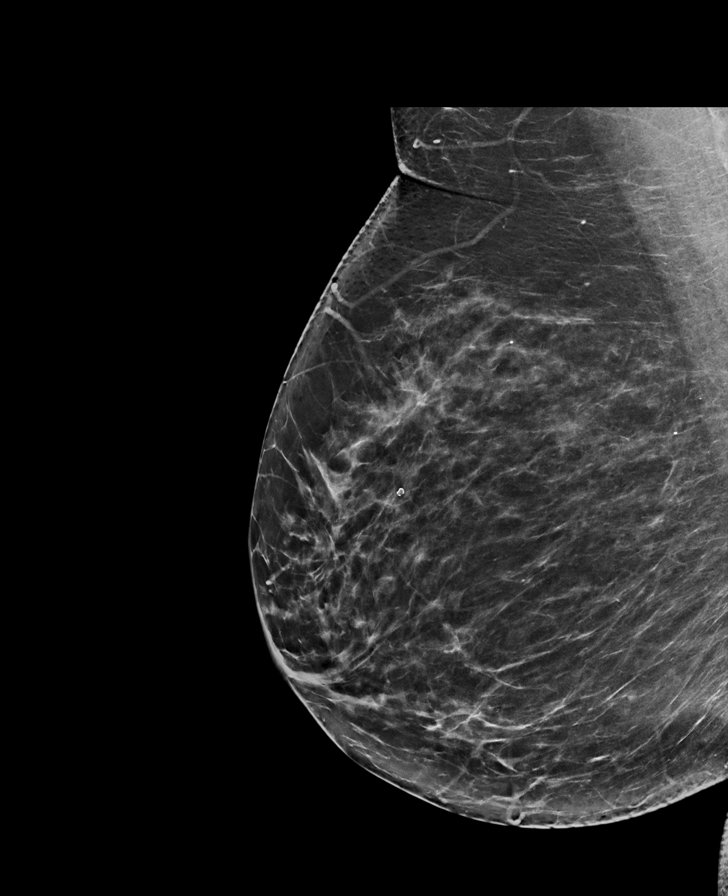

[L CC synth-2D]
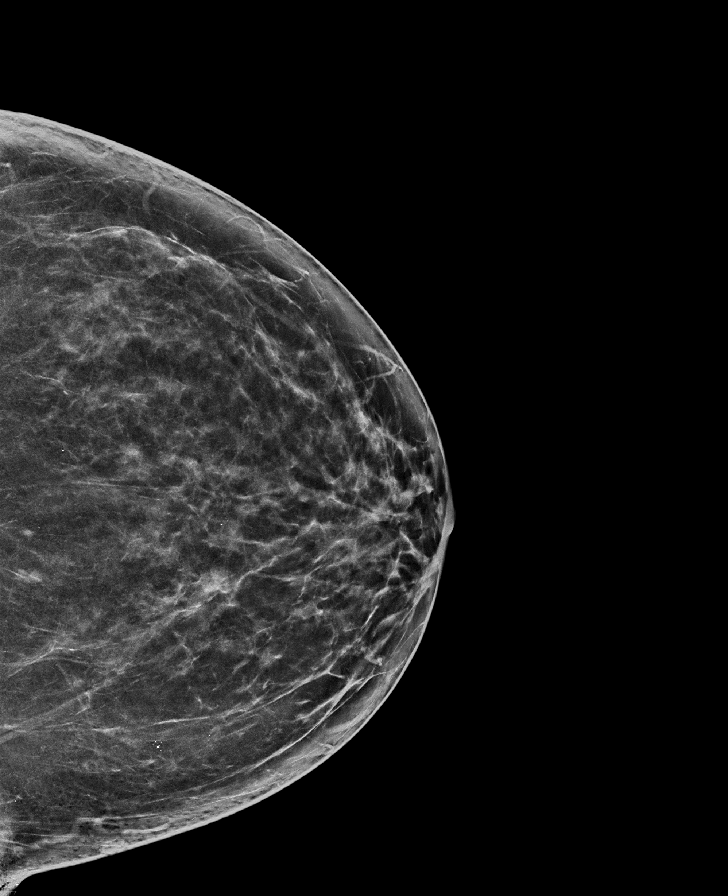

[L MLO synth-2D]
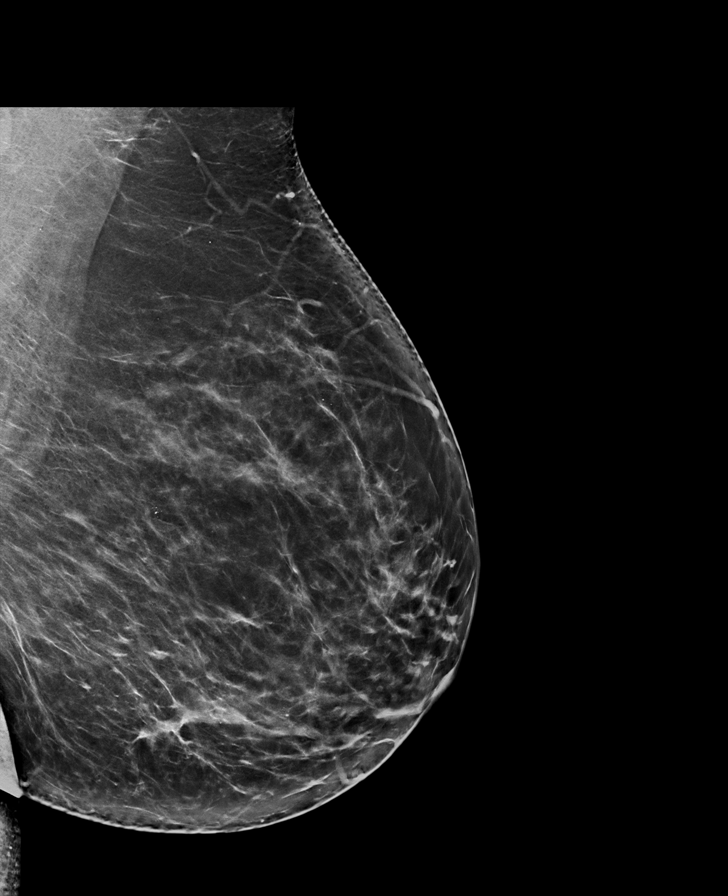

[R CC tomo · tomo slice 39/78.0]
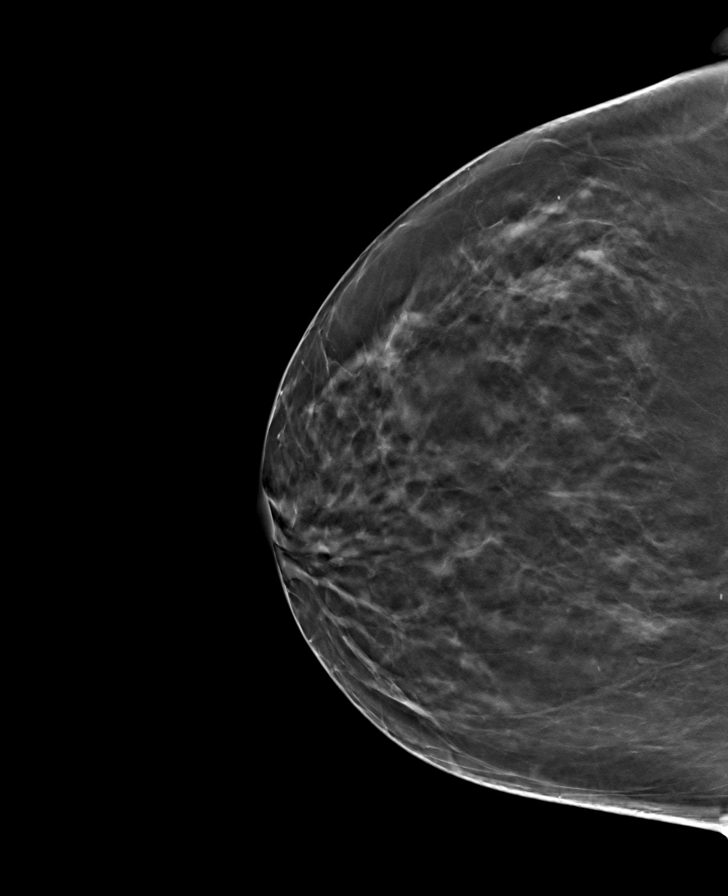

[L MLO tomo · tomo slice 45/90.0]
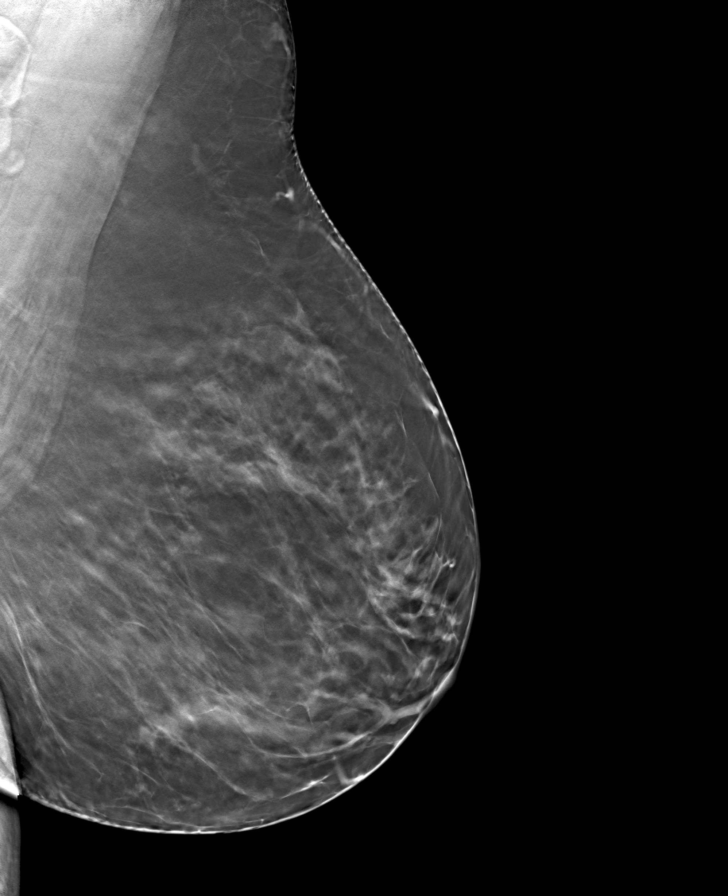

[R MLO tomo · tomo slice 43/86.0]
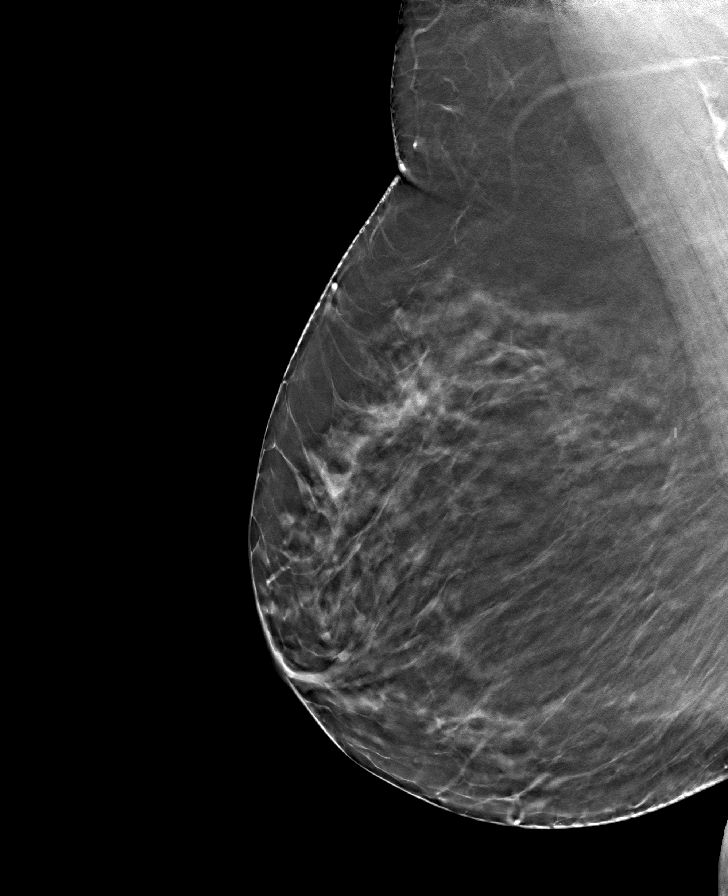

[L CC tomo · tomo slice 39/78.0]
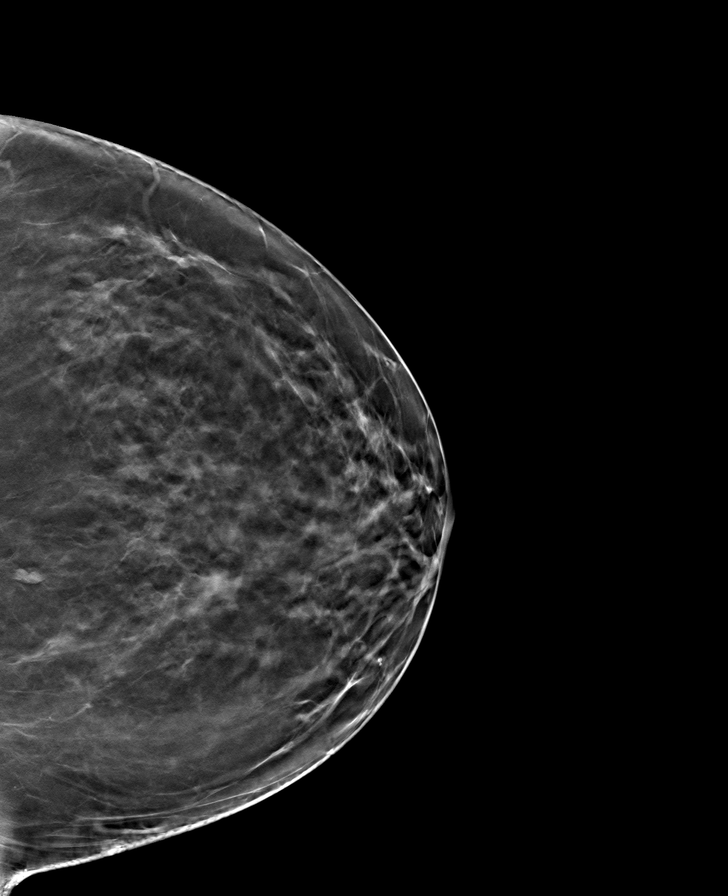

[8 of 24 positions shown; findings below may reference images not displayed]

ACR Breast Density Category b: There are scattered areas of
fibroglandular density.
FINDINGS: There are no findings suspicious for malignancy. Images were
processed with CAD.
IMPRESSION: No mammographic evidence of malignancy. A result letter of this
screening mammogram will be mailed directly to the patient.

RECOMMENDATION:
Screening mammogram in one year. (Code:CN-U-775)

BI-RADS CATEGORY  1: Negative.

## 2021-08-22 ENCOUNTER — Encounter (INDEPENDENT_AMBULATORY_CARE_PROVIDER_SITE_OTHER): Payer: PRIVATE HEALTH INSURANCE | Admitting: Ophthalmology

## 2021-08-22 ENCOUNTER — Other Ambulatory Visit: Payer: Self-pay

## 2021-08-22 DIAGNOSIS — H35033 Hypertensive retinopathy, bilateral: Secondary | ICD-10-CM

## 2021-08-22 DIAGNOSIS — H43813 Vitreous degeneration, bilateral: Secondary | ICD-10-CM | POA: Diagnosis not present

## 2021-08-22 DIAGNOSIS — I10 Essential (primary) hypertension: Secondary | ICD-10-CM

## 2021-08-22 DIAGNOSIS — H2513 Age-related nuclear cataract, bilateral: Secondary | ICD-10-CM

## 2021-08-22 DIAGNOSIS — H4311 Vitreous hemorrhage, right eye: Secondary | ICD-10-CM

## 2021-09-19 ENCOUNTER — Encounter (INDEPENDENT_AMBULATORY_CARE_PROVIDER_SITE_OTHER): Payer: PRIVATE HEALTH INSURANCE | Admitting: Ophthalmology

## 2021-09-19 ENCOUNTER — Other Ambulatory Visit: Payer: Self-pay

## 2021-09-19 DIAGNOSIS — H4311 Vitreous hemorrhage, right eye: Secondary | ICD-10-CM

## 2021-09-19 DIAGNOSIS — D3131 Benign neoplasm of right choroid: Secondary | ICD-10-CM

## 2021-09-19 DIAGNOSIS — I10 Essential (primary) hypertension: Secondary | ICD-10-CM

## 2021-09-19 DIAGNOSIS — H43813 Vitreous degeneration, bilateral: Secondary | ICD-10-CM

## 2021-09-19 DIAGNOSIS — H35033 Hypertensive retinopathy, bilateral: Secondary | ICD-10-CM | POA: Diagnosis not present

## 2021-10-21 ENCOUNTER — Encounter (INDEPENDENT_AMBULATORY_CARE_PROVIDER_SITE_OTHER): Payer: PRIVATE HEALTH INSURANCE | Admitting: Ophthalmology

## 2021-11-20 ENCOUNTER — Encounter (INDEPENDENT_AMBULATORY_CARE_PROVIDER_SITE_OTHER): Payer: PRIVATE HEALTH INSURANCE | Admitting: Ophthalmology

## 2021-12-13 ENCOUNTER — Encounter (INDEPENDENT_AMBULATORY_CARE_PROVIDER_SITE_OTHER): Payer: BC Managed Care – PPO | Admitting: Ophthalmology

## 2021-12-13 ENCOUNTER — Other Ambulatory Visit: Payer: Self-pay

## 2021-12-13 DIAGNOSIS — I10 Essential (primary) hypertension: Secondary | ICD-10-CM

## 2021-12-13 DIAGNOSIS — H43813 Vitreous degeneration, bilateral: Secondary | ICD-10-CM

## 2021-12-13 DIAGNOSIS — D3131 Benign neoplasm of right choroid: Secondary | ICD-10-CM

## 2021-12-13 DIAGNOSIS — H4311 Vitreous hemorrhage, right eye: Secondary | ICD-10-CM | POA: Diagnosis not present

## 2021-12-13 DIAGNOSIS — H35033 Hypertensive retinopathy, bilateral: Secondary | ICD-10-CM

## 2022-03-19 ENCOUNTER — Encounter (INDEPENDENT_AMBULATORY_CARE_PROVIDER_SITE_OTHER): Payer: PRIVATE HEALTH INSURANCE | Admitting: Ophthalmology

## 2022-12-05 ENCOUNTER — Encounter: Payer: Self-pay | Admitting: Family Medicine

## 2022-12-31 ENCOUNTER — Other Ambulatory Visit: Payer: Self-pay | Admitting: Family Medicine

## 2022-12-31 DIAGNOSIS — Z1231 Encounter for screening mammogram for malignant neoplasm of breast: Secondary | ICD-10-CM

## 2023-01-08 ENCOUNTER — Ambulatory Visit
Admission: RE | Admit: 2023-01-08 | Discharge: 2023-01-08 | Disposition: A | Discharge status: Home / Self Care | Payer: 59 | Source: Ambulatory Visit | Attending: Family Medicine | Admitting: Family Medicine

## 2023-01-08 DIAGNOSIS — Z1231 Encounter for screening mammogram for malignant neoplasm of breast: Secondary | ICD-10-CM | POA: Diagnosis present

## 2024-03-22 ENCOUNTER — Telehealth: Payer: Self-pay

## 2024-03-22 ENCOUNTER — Other Ambulatory Visit: Payer: Self-pay

## 2024-03-22 DIAGNOSIS — Z860100 Personal history of colon polyps, unspecified: Secondary | ICD-10-CM

## 2024-03-22 MED ORDER — NA SULFATE-K SULFATE-MG SULF 17.5-3.13-1.6 GM/177ML PO SOLN: 1.0000 | Freq: Once | ORAL | 0 refills | Status: AC

## 2024-03-22 NOTE — Telephone Encounter (Signed)
 Gastroenterology Pre-Procedure Review  Request Date: 04/12/24 Requesting Physician: Dr. Servando Snare  PATIENT REVIEW QUESTIONS: The patient responded to the following health history questions as indicated:    1. Are you having any GI issues? no 2. Do you have a personal history of Polyps? yes (last colonoscopy performed by Dr. Servando Snare 11/30/15 recommended repeat in 5 years) 3. Do you have a family history of Colon Cancer or Polyps? no 4. Diabetes Mellitus? no 5. Joint replacements in the past 12 months?no 6. Major health problems in the past 3 months?no 7. Any artificial heart valves, MVP, or defibrillator?no    MEDICATIONS & ALLERGIES:    Patient reports the following regarding taking any anticoagulation/antiplatelet therapy:   Plavix, Coumadin, Eliquis, Xarelto, Lovenox, Pradaxa, Brilinta, or Effient? no Aspirin? no  Patient confirms/reports the following medications:  Current Outpatient Medications  Medication Sig Dispense Refill   ALPRAZolam (XANAX) 0.5 MG tablet 1/2 to one as needed for anxiety (Patient not taking: Reported on 11/08/2015) 15 tablet 0   atorvastatin (LIPITOR) 10 MG tablet Take 1 tablet (10 mg total) by mouth daily. (Patient not taking: Reported on 11/30/2015) 90 tablet 3   citalopram (CELEXA) 20 MG tablet Take by mouth. 1/2 tablet every other day     fluticasone (FLONASE) 50 MCG/ACT nasal spray Place 2 sprays into the nose.      hydrochlorothiazide (HYDRODIURIL) 12.5 MG tablet Take 1 tablet (12.5 mg total) by mouth daily. 30 tablet 0   loratadine (CLARITIN) 10 MG tablet Take 10 mg by mouth daily as needed.      naproxen sodium (ALEVE) 220 MG tablet Take by mouth.      No current facility-administered medications for this visit.    Patient confirms/reports the following allergies:  Not on File  No orders of the defined types were placed in this encounter.   AUTHORIZATION INFORMATION Primary Insurance: 1D#: Group #:  Secondary Insurance: 1D#: Group #:  SCHEDULE  INFORMATION: Date: 04/12/24 Time: Location: [facility]

## 2024-03-22 NOTE — Telephone Encounter (Signed)
 Pt lmovm stating that she missed a call from [place], returning her call

## 2024-04-12 ENCOUNTER — Ambulatory Visit
Admission: RE | Admit: 2024-04-12 | Discharge: 2024-04-12 | Disposition: A | Discharge status: Home / Self Care | Attending: Gastroenterology | Admitting: Gastroenterology

## 2024-04-12 ENCOUNTER — Encounter: Payer: Self-pay | Admitting: Gastroenterology

## 2024-04-12 ENCOUNTER — Encounter
Admission: RE | Disposition: A | Discharge status: Home / Self Care | Payer: Self-pay | Source: Home / Self Care | Attending: Gastroenterology

## 2024-04-12 ENCOUNTER — Ambulatory Visit: Admitting: Certified Registered"

## 2024-04-12 DIAGNOSIS — I10 Essential (primary) hypertension: Secondary | ICD-10-CM | POA: Diagnosis not present

## 2024-04-12 DIAGNOSIS — K640 First degree hemorrhoids: Secondary | ICD-10-CM | POA: Diagnosis not present

## 2024-04-12 DIAGNOSIS — F419 Anxiety disorder, unspecified: Secondary | ICD-10-CM | POA: Diagnosis not present

## 2024-04-12 DIAGNOSIS — Z860100 Personal history of colon polyps, unspecified: Secondary | ICD-10-CM

## 2024-04-12 DIAGNOSIS — Z1211 Encounter for screening for malignant neoplasm of colon: Secondary | ICD-10-CM

## 2024-04-12 DIAGNOSIS — D125 Benign neoplasm of sigmoid colon: Secondary | ICD-10-CM

## 2024-04-12 DIAGNOSIS — F32A Depression, unspecified: Secondary | ICD-10-CM | POA: Diagnosis not present

## 2024-04-12 DIAGNOSIS — K635 Polyp of colon: Secondary | ICD-10-CM

## 2024-04-12 HISTORY — PX: COLONOSCOPY: SHX5424

## 2024-04-12 HISTORY — PX: POLYPECTOMY: SHX149

## 2024-04-12 SURGERY — COLONOSCOPY
Anesthesia: General

## 2024-04-12 MED ORDER — PROPOFOL 500 MG/50ML IV EMUL
INTRAVENOUS | Status: DC | PRN
  Administered 2024-04-12: 150 ug/kg/min via INTRAVENOUS

## 2024-04-12 MED ORDER — SODIUM CHLORIDE 0.9 % IV SOLN: INTRAVENOUS | Status: DC

## 2024-04-12 MED ORDER — PROPOFOL 10 MG/ML IV BOLUS
INTRAVENOUS | Status: DC | PRN
  Administered 2024-04-12: 20 mg via INTRAVENOUS
  Administered 2024-04-12: 40 mg via INTRAVENOUS

## 2024-04-12 NOTE — Anesthesia Postprocedure Evaluation (Signed)
 Anesthesia Post Note  Patient: Lorraine Robinson  Procedure(s) Performed: COLONOSCOPY POLYPECTOMY, INTESTINE  Patient location during evaluation: PACU Anesthesia Type: General Level of consciousness: awake and alert, oriented and patient cooperative Pain management: pain level controlled Vital Signs Assessment: post-procedure vital signs reviewed and stable Respiratory status: spontaneous breathing, nonlabored ventilation and respiratory function stable Cardiovascular status: blood pressure returned to baseline and stable Postop Assessment: adequate PO intake Anesthetic complications: no   No notable events documented.   Last Vitals:  Vitals:   04/12/24 1013 04/12/24 1033  BP: 129/66 (!) 141/87  Pulse: 61   Resp: 17   Temp: (!) 36.1 C   SpO2: 99%     Last Pain:  Vitals:   04/12/24 1033  TempSrc:   PainSc: 0-No pain                 Dorothey Gate

## 2024-04-12 NOTE — Anesthesia Procedure Notes (Signed)
 Procedure Name: MAC Date/Time: 04/12/2024 9:58 AM  Performed by: Ulanda Gambles, CRNAPre-anesthesia Checklist: Emergency Drugs available, Patient identified, Suction available and Patient being monitored Oxygen Delivery Method: Nasal cannula

## 2024-04-12 NOTE — Anesthesia Preprocedure Evaluation (Addendum)
 Anesthesia Evaluation  Patient identified by MRN, date of birth, ID band Patient awake    Reviewed: Allergy & Precautions, NPO status , Patient's Chart, lab work & pertinent test results  History of Anesthesia Complications Negative for: history of anesthetic complications  Airway Mallampati: IV   Neck ROM: Full    Dental no notable dental hx.    Pulmonary neg pulmonary ROS   Pulmonary exam normal breath sounds clear to auscultation       Cardiovascular hypertension, Normal cardiovascular exam Rhythm:Regular Rate:Normal     Neuro/Psych  PSYCHIATRIC DISORDERS Anxiety Depression    Vertigo     GI/Hepatic negative GI ROS,,,  Endo/Other  negative endocrine ROS    Renal/GU negative Renal ROS     Musculoskeletal   Abdominal   Peds  Hematology negative hematology ROS (+)   Anesthesia Other Findings   Reproductive/Obstetrics                             Anesthesia Physical Anesthesia Plan  ASA: 2  Anesthesia Plan: General   Post-op Pain Management:    Induction: Intravenous  PONV Risk Score and Plan: 3 and Propofol infusion, TIVA and Treatment may vary due to age or medical condition  Airway Management Planned: Natural Airway  Additional Equipment:   Intra-op Plan:   Post-operative Plan:   Informed Consent: I have reviewed the patients History and Physical, chart, labs and discussed the procedure including the risks, benefits and alternatives for the proposed anesthesia with the patient or authorized representative who has indicated his/her understanding and acceptance.       Plan Discussed with: CRNA  Anesthesia Plan Comments: (LMA/GETA backup discussed.  Patient consented for risks of anesthesia including but not limited to:  - adverse reactions to medications - damage to eyes, teeth, lips or other oral mucosa - nerve damage due to positioning  - sore throat or  hoarseness - damage to heart, brain, nerves, lungs, other parts of body or loss of life  Informed patient about role of CRNA in peri- and intra-operative care.  Patient voiced understanding.)       Anesthesia Quick Evaluation

## 2024-04-12 NOTE — Op Note (Signed)
 [facility] Gastroenterology Patient Name: Lorraine Robinson Procedure Date: 04/12/2024 9:49 AM MRN: [id number] Account #: [id number] Date of Birth: 02/08/1961 Admit Type: Outpatient Age: 63 Room: [facility] ENDO ROOM 4 Gender: Female Note Status: Finalized Instrument Name: Prentice Docker [id number] Procedure:             Colonoscopy Indications:           High risk colon cancer surveillance: Personal history                         of colonic polyps Providers:             Midge Minium MD, MD Referring MD:          Dortha Kern (Referring MD) Medicines:             Propofol per Anesthesia Complications:         No immediate complications. Procedure:             Pre-Anesthesia Assessment:                        - Prior to the procedure, a History and Physical was                         performed, and patient medications and allergies were                         reviewed. The patient's tolerance of previous                         anesthesia was also reviewed. The risks and benefits                         of the procedure and the sedation options and risks                         were discussed with the patient. All questions were                         answered, and informed consent was obtained. Prior                         Anticoagulants: The patient has taken no anticoagulant                         or antiplatelet agents. ASA Grade Assessment: II - A                         patient with mild systemic disease. After reviewing                         the risks and benefits, the patient was deemed in                         satisfactory condition to undergo the procedure.                        After obtaining informed consent, the colonoscope was  passed under direct vision. Throughout the procedure,                         the patient's blood pressure, pulse, and oxygen                         saturations were monitored continuously. The                          Colonoscope was introduced through the anus and                         advanced to the the cecum, identified by appendiceal                         orifice and ileocecal valve. The colonoscopy was                         performed without difficulty. The patient tolerated                         the procedure well. The quality of the bowel                         preparation was excellent. Findings:      The perianal and digital rectal examinations were normal.      A 4 mm polyp was found in the sigmoid colon. The polyp was sessile. The       polyp was removed with a cold snare. Resection and retrieval were       complete.      Non-bleeding internal hemorrhoids were found during retroflexion. The       hemorrhoids were Grade I (internal hemorrhoids that do not prolapse). Impression:            - One 4 mm polyp in the sigmoid colon, removed with a                         cold snare. Resected and retrieved.                        - Non-bleeding internal hemorrhoids. Recommendation:        - Discharge patient to home.                        - Resume previous diet.                        - Continue present medications.                        - Await pathology results.                        - Repeat colonoscopy in 7 years for surveillance. Procedure Code(s):     --- Professional ---                        [phone], Colonoscopy, flexible; with removal of  tumor(s), polyp(s), or other lesion(s) by snare                         technique Diagnosis Code(s):     --- Professional ---                        Z86.010, Personal history of colonic polyps                        D12.5, Benign neoplasm of sigmoid colon CPT copyright 2022 American Medical Association. All rights reserved. The codes documented in this report are preliminary and upon coder review may  be revised to meet current compliance requirements. Marnee Sink MD, MD 04/12/2024 10:13:39 AM This  report has been signed electronically. Number of Addenda: 0 Note Initiated On: 04/12/2024 9:49 AM Scope Withdrawal Time: 0 hours 6 minutes 6 seconds  Total Procedure Duration: 0 hours 10 minutes 27 seconds  Estimated Blood Loss:  Estimated blood loss: none.      [facility]

## 2024-04-12 NOTE — Transfer of Care (Signed)
 Immediate Anesthesia Transfer of Care Note  Patient: Lorraine Robinson  Procedure(s) Performed: COLONOSCOPY POLYPECTOMY, INTESTINE  Patient Location: PACU  Anesthesia Type:General  Level of Consciousness: drowsy  Airway & Oxygen Therapy: Patient Spontanous Breathing and Patient connected to nasal cannula oxygen  Post-op Assessment: Report given to RN, Post -op Vital signs reviewed and stable, and Patient moving all extremities X 4  Post vital signs: Reviewed and stable  Last Vitals:  Vitals Value Taken Time  BP 129/66 04/12/24 1014  Temp 36.1 C 04/12/24 1013  Pulse 61 04/12/24 1016  Resp 17 04/12/24 1016  SpO2 99 % 04/12/24 1016  Vitals shown include unfiled device data.  Last Pain:  Vitals:   04/12/24 1013  TempSrc: Temporal  PainSc: Asleep         Complications: No notable events documented.

## 2024-04-12 NOTE — H&P (Signed)
 Midge Minium, MD [facility] [address], [address] [place], [place] [address] Phone:[phone] Fax : [phone]  Primary Care Physician:  Dortha Kern, MD Primary Gastroenterologist:  Dr. Servando Snare  Pre-Procedure History & Physical: HPI:  Lorraine Robinson is a 63 y.o. female is here for an colonoscopy.   Past Medical History:  Diagnosis Date   Allergic rhinitis    Anxiety    Depression    GERD (gastroesophageal reflux disease)    in past   Hyperlipidemia    Hypertension    Sinus drainage    Vertigo     Past Surgical History:  Procedure Laterality Date   COLONOSCOPY WITH PROPOFOL N/A 11/30/2015   Procedure: COLONOSCOPY WITH PROPOFOL;  Surgeon: Midge Minium, MD;  Location: [facility];  Service: Endoscopy;  Laterality: N/A;   LIPOMA EXCISION Right 1987   Arm   POLYPECTOMY  11/30/2015   Procedure: POLYPECTOMY;  Surgeon: Midge Minium, MD;  Location: [facility];  Service: Endoscopy;;   TONSILLECTOMY AND ADENOIDECTOMY  1960's    Prior to Admission medications   Medication Sig Start Date End Date Taking? Authorizing Provider  FLUoxetine (PROZAC) 20 MG capsule Take 20 mg by mouth daily. 02/12/24  Yes [provider]  hydrochlorothiazide (HYDRODIURIL) 12.5 MG tablet Take 1 tablet (12.5 mg total) by mouth daily. 02/19/17  Yes Margaretann Loveless, PA-C  naproxen sodium (ALEVE) 220 MG tablet Take by mouth.   Yes [provider]  Omega-3 Fatty Acids (FISH OIL) 300 MG CAPS Take by mouth.   Yes [provider]  ALPRAZolam Prudy Feeler) 0.5 MG tablet 1/2 to one as needed for anxiety Patient not taking: Reported on 11/08/2015 07/03/15   Anola Gurney, PA  atorvastatin (LIPITOR) 10 MG tablet Take 1 tablet (10 mg total) by mouth daily. Patient not taking: Reported on 11/30/2015 11/09/15   Lorie Phenix, MD  citalopram (CELEXA) 20 MG tablet Take by mouth. 1/2 tablet every other day Patient not taking: Reported on 03/22/2024 08/29/13   [provider]  fluticasone (FLONASE) 50 MCG/ACT nasal spray Place 2 sprays into the nose.  Patient not taking: Reported on 03/22/2024 09/14/14   [provider]  loratadine (CLARITIN) 10 MG tablet Take 10 mg by mouth daily as needed.  Patient not taking: Reported on 03/22/2024 08/07/10   [provider]    Allergies as of 03/22/2024   (Not on File)    Family History  Problem Relation Age of Onset   Dementia Mother    COPD Mother    Breast cancer Mother 57   Diabetes Father    Heart disease Father    Breast cancer Maternal Aunt 53   Breast cancer Cousin 83       mat cousin    Social History   Socioeconomic History   Marital status: Married    Spouse name: Not on file   Number of children: Not on file   Years of education: [employer]   Highest education level: Not on file  Occupational History   Occupation: Employed  Tobacco Use   Smoking status: Never   Smokeless tobacco: Not on file  Vaping Use   Vaping status: Never Used  Substance and Sexual Activity   Alcohol use: No    Alcohol/week: 0.0 standard drinks of alcohol   Drug use: No   Sexual activity: Not on file  Other Topics Concern   Not on file  Social History Narrative   Not on file   Social  Drivers of Corporate investment banker Strain: Not on file  Food Insecurity: Not on file  Transportation Needs: Not on file  Physical Activity: Not on file  Stress: Not on file  Social Connections: Not on file  Intimate Partner Violence: Not on file    Review of Systems: See HPI, otherwise negative ROS  Physical Exam: BP (!) 169/105   Pulse 68   Temp (!) 96.8 F (36 C) (Temporal)   Resp 16   Ht 5\' 4"  (1.626 m)   Wt 81.9 kg   SpO2 100%   BMI 31.00 kg/m  General:   Alert,  pleasant and cooperative in NAD Head:  Normocephalic and atraumatic. Neck:  Supple; no masses or thyromegaly. Lungs:  Clear throughout to auscultation.    Heart:  Regular rate and rhythm. Abdomen:  Soft, nontender and  nondistended. Normal bowel sounds, without guarding, and without rebound.   Neurologic:  Alert and  oriented x4;  grossly normal neurologically.  Impression/Plan: Lorraine Robinson is here for an colonoscopy to be performed for a history of adenomatous polyps on 2016   Risks, benefits, limitations, and alternatives regarding  colonoscopy have been reviewed with the patient.  Questions have been answered.  All parties agreeable.   Marnee Sink, MD  04/12/2024, 9:27 AM

## 2024-04-13 ENCOUNTER — Encounter: Payer: Self-pay | Admitting: Gastroenterology

## 2024-04-13 LAB — SURGICAL PATHOLOGY
